# Patient Record
Sex: Male | Born: 1995 | Race: White | Hispanic: No | Marital: Married | Smoking: Current some day smoker
Health system: Southern US, Community
[De-identification: ages and names within clinical notes are randomized; demographics above are authoritative.]

---

## 2016-01-18 ENCOUNTER — Emergency Department
Admission: EM | Admit: 2016-01-18 | Discharge: 2016-01-18 | Disposition: A | Discharge status: Home / Self Care | Attending: Emergency Medicine | Admitting: Emergency Medicine

## 2016-01-18 ENCOUNTER — Emergency Department

## 2016-01-18 ENCOUNTER — Encounter: Payer: Self-pay | Admitting: *Deleted

## 2016-01-18 DIAGNOSIS — M25522 Pain in left elbow: Secondary | ICD-10-CM | POA: Insufficient documentation

## 2016-01-18 DIAGNOSIS — S80812A Abrasion, left lower leg, initial encounter: Secondary | ICD-10-CM | POA: Insufficient documentation

## 2016-01-18 DIAGNOSIS — S0181XA Laceration without foreign body of other part of head, initial encounter: Secondary | ICD-10-CM | POA: Insufficient documentation

## 2016-01-18 DIAGNOSIS — S0990XA Unspecified injury of head, initial encounter: Secondary | ICD-10-CM | POA: Diagnosis present

## 2016-01-18 DIAGNOSIS — Y999 Unspecified external cause status: Secondary | ICD-10-CM | POA: Diagnosis not present

## 2016-01-18 DIAGNOSIS — M25561 Pain in right knee: Secondary | ICD-10-CM | POA: Diagnosis not present

## 2016-01-18 DIAGNOSIS — S60511A Abrasion of right hand, initial encounter: Secondary | ICD-10-CM | POA: Diagnosis not present

## 2016-01-18 DIAGNOSIS — M545 Low back pain, unspecified: Secondary | ICD-10-CM

## 2016-01-18 DIAGNOSIS — Y92410 Unspecified street and highway as the place of occurrence of the external cause: Secondary | ICD-10-CM | POA: Insufficient documentation

## 2016-01-18 DIAGNOSIS — Y9389 Activity, other specified: Secondary | ICD-10-CM | POA: Diagnosis not present

## 2016-01-18 DIAGNOSIS — F17290 Nicotine dependence, other tobacco product, uncomplicated: Secondary | ICD-10-CM | POA: Diagnosis not present

## 2016-01-18 MED ORDER — LIDOCAINE HCL (PF) 1 % IJ SOLN
INTRAMUSCULAR | Status: AC
  Filled 2016-01-18: qty 5

## 2016-01-18 MED ORDER — NAPROXEN 500 MG PO TABS: 500.0000 mg | ORAL_TABLET | Freq: Two times a day (BID) | ORAL | Status: AC

## 2016-01-18 MED ORDER — MORPHINE SULFATE (PF) 4 MG/ML IV SOLN
4.0000 mg | Freq: Once | INTRAVENOUS | Status: AC
  Administered 2016-01-18: 4 mg via INTRAVENOUS
  Filled 2016-01-18: qty 1

## 2016-01-18 MED ORDER — ONDANSETRON HCL 4 MG/2ML IJ SOLN
4.0000 mg | Freq: Once | INTRAMUSCULAR | Status: AC
  Administered 2016-01-18: 4 mg via INTRAVENOUS
  Filled 2016-01-18: qty 2

## 2016-01-18 MED ORDER — MORPHINE SULFATE (PF) 2 MG/ML IV SOLN
2.0000 mg | Freq: Once | INTRAVENOUS | Status: AC
  Administered 2016-01-18: 2 mg via INTRAVENOUS
  Filled 2016-01-18: qty 1

## 2016-01-18 MED ORDER — IOPAMIDOL (ISOVUE-300) INJECTION 61%
100.0000 mL | Freq: Once | INTRAVENOUS | Status: AC | PRN
  Administered 2016-01-18: 100 mL via INTRAVENOUS
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
## 2016-01-18 NOTE — ED Notes (Signed)
MD at bedside. 
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
